# Patient Record
Sex: Female | Born: 1995 | Race: White | Hispanic: No | Marital: Single | Smoking: Never smoker
Health system: Southern US, Community
[De-identification: ages and names within clinical notes are randomized; demographics above are authoritative.]

## PROBLEM LIST (undated history)

## (undated) DIAGNOSIS — F419 Anxiety disorder, unspecified: Secondary | ICD-10-CM

## (undated) DIAGNOSIS — G709 Myoneural disorder, unspecified: Secondary | ICD-10-CM

## (undated) DIAGNOSIS — F329 Major depressive disorder, single episode, unspecified: Secondary | ICD-10-CM

## (undated) DIAGNOSIS — T7840XA Allergy, unspecified, initial encounter: Secondary | ICD-10-CM

## (undated) DIAGNOSIS — F32A Depression, unspecified: Secondary | ICD-10-CM

## (undated) HISTORY — DX: Anxiety disorder, unspecified: F41.9

## (undated) HISTORY — DX: Allergy, unspecified, initial encounter: T78.40XA

## (undated) HISTORY — DX: Myoneural disorder, unspecified: G70.9

## (undated) HISTORY — DX: Depression, unspecified: F32.A

## (undated) HISTORY — DX: Major depressive disorder, single episode, unspecified: F32.9

---

## 2016-02-04 ENCOUNTER — Encounter: Payer: Self-pay | Admitting: Internal Medicine

## 2016-02-04 ENCOUNTER — Ambulatory Visit (INDEPENDENT_AMBULATORY_CARE_PROVIDER_SITE_OTHER): Payer: BLUE CROSS/BLUE SHIELD | Admitting: Internal Medicine

## 2016-02-04 DIAGNOSIS — E669 Obesity, unspecified: Secondary | ICD-10-CM | POA: Insufficient documentation

## 2016-02-04 DIAGNOSIS — F329 Major depressive disorder, single episode, unspecified: Secondary | ICD-10-CM

## 2016-02-04 DIAGNOSIS — K2960 Other gastritis without bleeding: Secondary | ICD-10-CM

## 2016-02-04 DIAGNOSIS — F419 Anxiety disorder, unspecified: Secondary | ICD-10-CM

## 2016-02-04 DIAGNOSIS — G6289 Other specified polyneuropathies: Secondary | ICD-10-CM | POA: Insufficient documentation

## 2016-02-04 DIAGNOSIS — L709 Acne, unspecified: Secondary | ICD-10-CM | POA: Insufficient documentation

## 2016-02-04 DIAGNOSIS — J302 Other seasonal allergic rhinitis: Secondary | ICD-10-CM | POA: Insufficient documentation

## 2016-02-04 DIAGNOSIS — G629 Polyneuropathy, unspecified: Secondary | ICD-10-CM

## 2016-02-04 DIAGNOSIS — F32A Depression, unspecified: Secondary | ICD-10-CM

## 2016-02-04 DIAGNOSIS — T3991XA Poisoning by unspecified nonopioid analgesic, antipyretic and antirheumatic, accidental (unintentional), initial encounter: Secondary | ICD-10-CM

## 2016-02-04 DIAGNOSIS — T39395A Adverse effect of other nonsteroidal anti-inflammatory drugs [NSAID], initial encounter: Secondary | ICD-10-CM

## 2016-02-04 NOTE — Progress Notes (Addendum)
[facility]  Reason for Consult:small fiber polyneuropathy Referring Physician: Dr. Delories Heinz  Patient Active Problem List   Diagnosis Date Noted  . 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 Depression 02/04/2016  . Anxiety 02/04/2016  . Obese 02/04/2016  . Acne 02/04/2016  . Seasonal allergies 02/04/2016  . Gastritis due to nonsteroidal anti-inflammatory drug 02/04/2016    Patient's Medications  New Prescriptions   No medications on file  Previous Medications   DOXYCYCLINE (ORACEA) 40 MG CAPSULE    Take 40 mg by mouth every morning.   ESCITALOPRAM (LEXAPRO) 10 MG TABLET    Take 15 mg by mouth daily.   NORTRIPTYLINE (PAMELOR) 10 MG CAPSULE    Take 10 mg by mouth at bedtime.   OMEPRAZOLE (PRILOSEC) 20 MG CAPSULE    Take 20 mg by mouth daily.  Modified Medications   No medications on file  Discontinued Medications   No medications on file    Recommendations: 1. Repeat Lyme serology   Assessment: I do not believe that her polyneuropathy is likely to be related to infection. She has had extensive testing with no obvious diagnosis. 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 She is still concerned about the possibility of Lyme disease. Although neurologic complications of Lyme are generally considered late manifestations of Lyme, occurring at a time when antibody should be positive have agreed to repeat serologies one more time to make sure that early neurologic complications have not been missed.  HPI: Alexa Montgomery is a 20 y.o. female who was in good health until early July of this year. 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 In early August she had sudden onset of tingling pain in her right foot. She describes it as feeling like her foot was falling asleep, pins and needles and tingling.  About a week later she felt some pain in her left foot and over the next day noted swelling and redness. She is in she had some type of insect bite. She was treated with a brief course of prednisone. Left foot pain and swelling resolved promptly. While she was on prednisone the tingling pain in her right foot went away. It recurred after stopping prednisone and then began to spread up her leg and to her left leg. Over the past several months she's also noted similar sensations in her hands, shoulders and face.over the course of the past several months she has seen 14 different providers. She's had a fairly extensive evaluation with all blood test being normal. She underwent a right leg EMG which was normal. Lyme serology done on 12/15/2015 was negative. She has no known exposure to ticks recently. She was seen by Dr. Gerlene Burdock on 12/31/2015. His note commented on a trigeminal nerve disorder although his documented cranial nerve exam was normal. Brain MRI showed a few, nonspecific white matter lesions in the frontal lobes that he attributed to previous trauma and unrelated to her symptoms. A skin biopsy was performed and read it revealed a small fiber polyneuropathy. Urine heavy metal screen was performed and was negative. A second Lyme antibody was performed on 01/10/2016 and was again negative. She has recently been started on nortriptyline and is feeling better. She is currently enrolled in [employer] and is an education major. After returning to school here she was also  seen by Dr. Amedeo GoryJames Chris[facility]. Blood work done there including serum protein electrophoresis was normal. She tells me that he concurred with the diagnosis of small fiber polyneuropathy. She has routine follow-up with him in 3 months.  She's been on treatment for depression and anxiety for the past 6 years and feels that is under very good control. She has never been sexually active. She does not  use tobacco products, drink alcohol and has never used any illicit drugs.  Review of Systems: Review of Systems  Constitutional: Negative for chills, diaphoresis, fever, malaise/fatigue and weight loss.  HENT: Positive for sore throat.   Eyes: Negative for blurred vision, double vision and photophobia.  Respiratory: Positive for cough. Negative for sputum production, shortness of breath and wheezing.   Cardiovascular: Positive for chest pain.       She had some chest pain last week and was seen in [place]. She was told that she had gastritis related to the end stage was taking or her neuropathic pain. She feels better since changing to acetaminophen.  Gastrointestinal: Positive for nausea. Negative for abdominal pain, diarrhea, heartburn and vomiting.  Genitourinary: Negative for dysuria.  Musculoskeletal: Negative for joint pain and myalgias.  Skin: Negative for rash.  Neurological: Positive for dizziness, tingling, sensory change, weakness and headaches. Negative for focal weakness and loss of consciousness.  Psychiatric/Behavioral: Negative for depression, memory loss and substance abuse. The patient is not nervous/anxious and does not have insomnia.       No past medical history on file.  Social History  Substance Use Topics  . Smoking status: Not on file  . Smokeless tobacco: Not on file  . Alcohol use Not on file    Family History  Problem Relation Age of Onset  . Cancer Mother   . Hypertension Father   . Hyperlipidemia Father    No Known Allergies  OBJECTIVE: Vitals:   02/04/16 1113  Temp: 98.4 F (36.9 C)  TempSrc: Oral  Weight: 194 lb (88 kg)  Height: 5\' 6"  (1.676 m)   Body mass index is 31.31 kg/m.   Physical Exam  Constitutional: She is oriented to person, place, and time.  She is a very pleasant young woman in no distress.  HENT:  Mouth/Throat: No oropharyngeal exudate.  Eyes: Conjunctivae and EOM are normal.  Neck: Neck  supple.  Cardiovascular: Normal rate and regular rhythm.   No murmur heard. Pulmonary/Chest: Effort normal and breath sounds normal. She has no wheezes. She has no rales.  Abdominal: Soft. She exhibits no mass. There is no tenderness.  Musculoskeletal: Normal range of motion. She exhibits no edema or tenderness.  Lymphadenopathy:    She has no cervical adenopathy.    She has no axillary adenopathy.       Right: Inguinal adenopathy present.       Left: No inguinal adenopathy present.  Neurological: She is alert and oriented to person, place, and time. She has normal reflexes. No cranial nerve deficit. Gait normal. Coordination normal.  Skin: No rash noted.  Some facial acne.  Psychiatric: Mood and affect normal.    Microbiology: No results found for this or any previous visit (from the past 240 hour(s)).  Cliffton AstersJohn Andreka Stucki, MD [facility] [phone] pager   [phone] cell 02/04/2016, 12:19 PM

## 2016-02-07 LAB — LYME AB/WESTERN BLOT REFLEX

## 2016-02-25 ENCOUNTER — Encounter (HOSPITAL_COMMUNITY): Payer: Self-pay | Admitting: *Deleted

## 2016-02-25 ENCOUNTER — Emergency Department (HOSPITAL_COMMUNITY): Payer: BLUE CROSS/BLUE SHIELD

## 2016-02-25 ENCOUNTER — Emergency Department (HOSPITAL_COMMUNITY)
Admission: EM | Admit: 2016-02-25 | Discharge: 2016-02-25 | Disposition: A | Discharge status: Home / Self Care | Payer: BLUE CROSS/BLUE SHIELD | Attending: Dermatology | Admitting: Dermatology

## 2016-02-25 DIAGNOSIS — Z5321 Procedure and treatment not carried out due to patient leaving prior to being seen by health care provider: Secondary | ICD-10-CM | POA: Insufficient documentation

## 2016-02-25 DIAGNOSIS — R079 Chest pain, unspecified: Secondary | ICD-10-CM | POA: Insufficient documentation

## 2016-02-25 DIAGNOSIS — R112 Nausea with vomiting, unspecified: Secondary | ICD-10-CM | POA: Insufficient documentation

## 2016-02-25 DIAGNOSIS — R109 Unspecified abdominal pain: Secondary | ICD-10-CM | POA: Diagnosis not present

## 2016-02-25 NOTE — ED Provider Notes (Signed)
ED ECG REPORT   Date: 02/25/2016  Rate: 77  Rhythm: normal sinus rhythm  QRS Axis: normal  Intervals: normal  ST/T Wave abnormalities: normal  Conduction Disutrbances:none  Narrative Interpretation:   Old EKG Reviewed: none available  I have personally reviewed the EKG tracing and agree with the computerized printout as noted.    Vanetta MuldersScott Tammy Ericsson, MD 02/25/16 1017

## 2016-02-25 NOTE — ED Triage Notes (Signed)
Pt states she is feeling better, she feels it was probably the new medication. Pt elects to leave

## 2016-02-25 NOTE — ED Triage Notes (Addendum)
Pt states she is having chest pain, describes it as sharp and aching, chest pain is all over including abd, nausea with vomiting, has been evaluated for this in the past (August 30ish) for same symptoms. Pt vomiting in triage,also started on Voltern last night

## 2017-03-21 IMAGING — CR DG CHEST 2V
2 series · 2 of 2 positions shown · non-contrast
Comparison: None.

CLINICAL DATA: Chest pain for 1 month, worsened today. Initial
encounter.

EXAM:
CHEST  2 VIEW

[w chest pa]
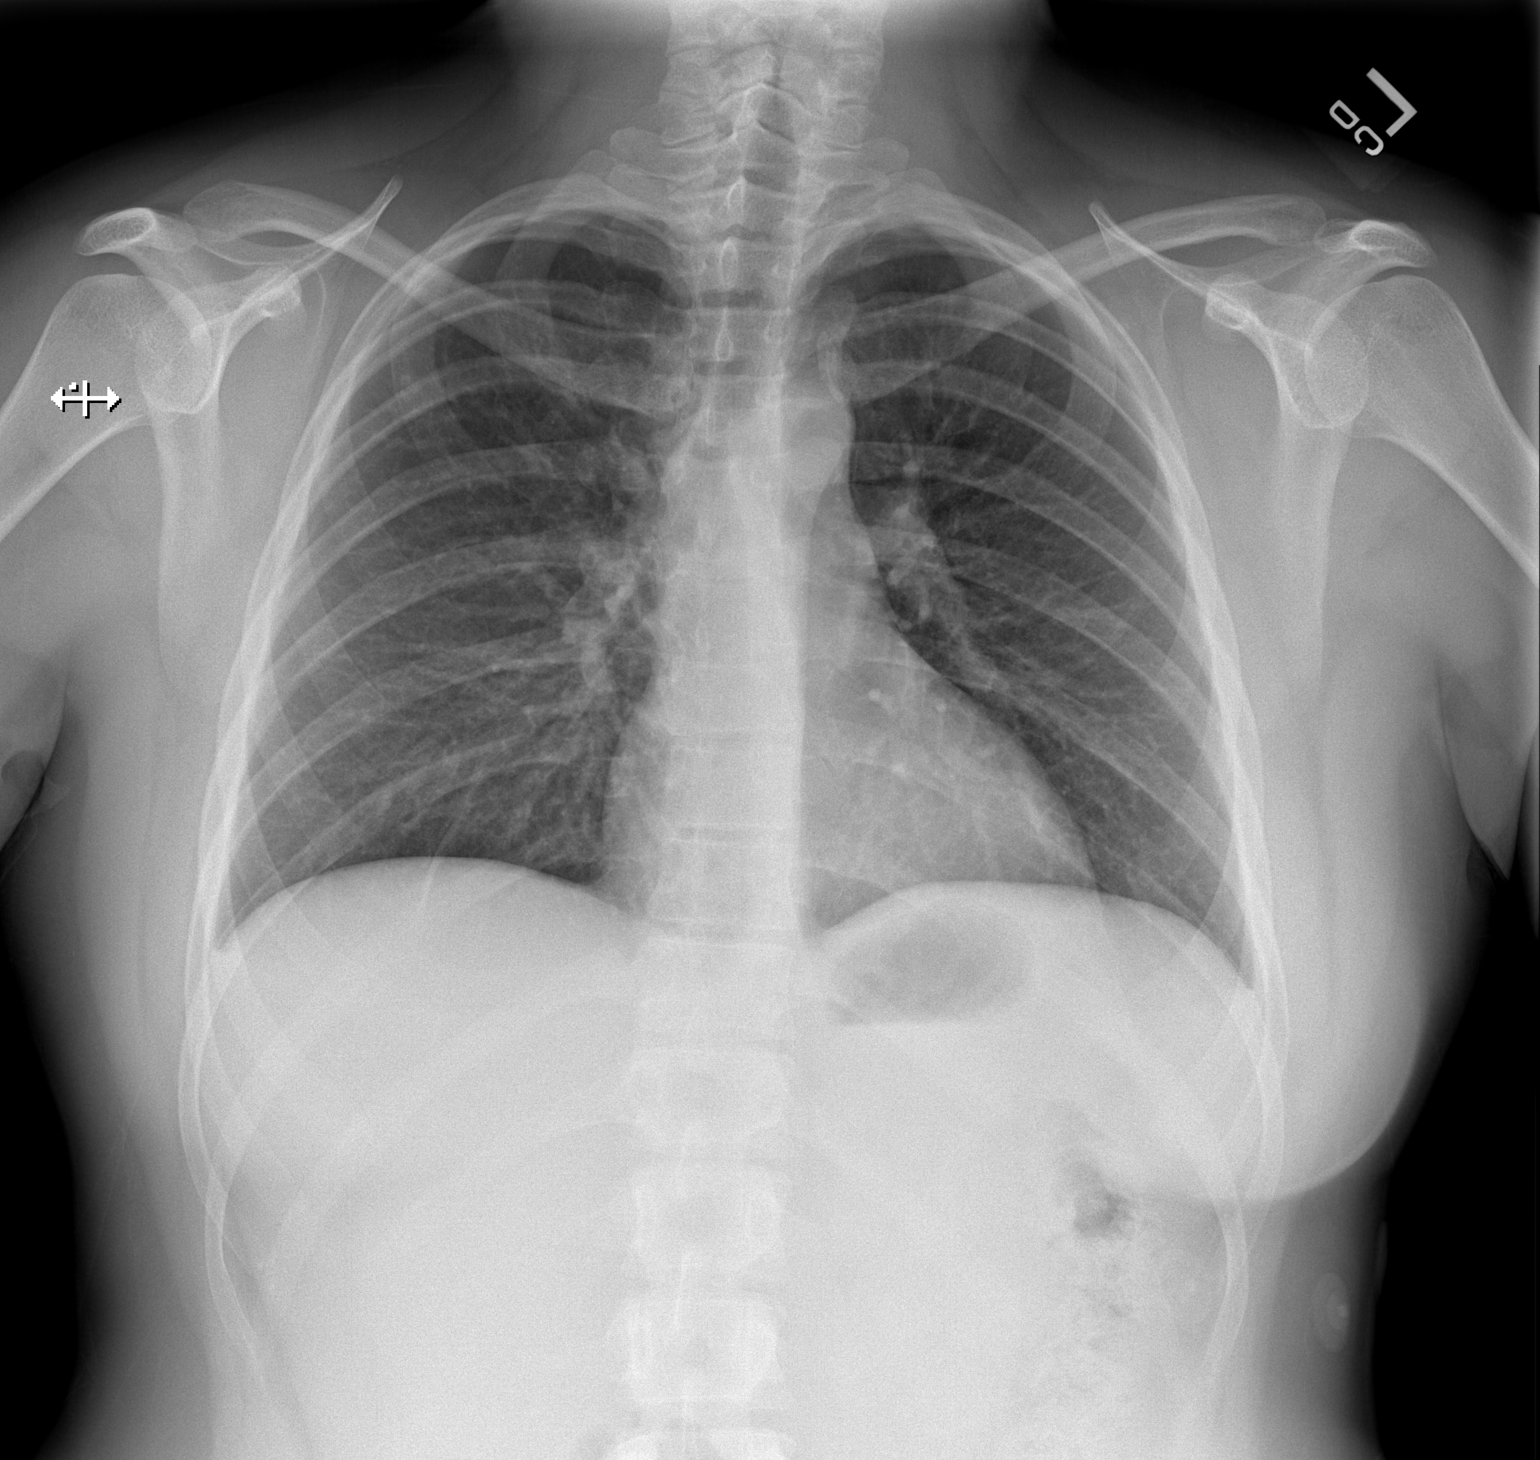

[w chest lat]
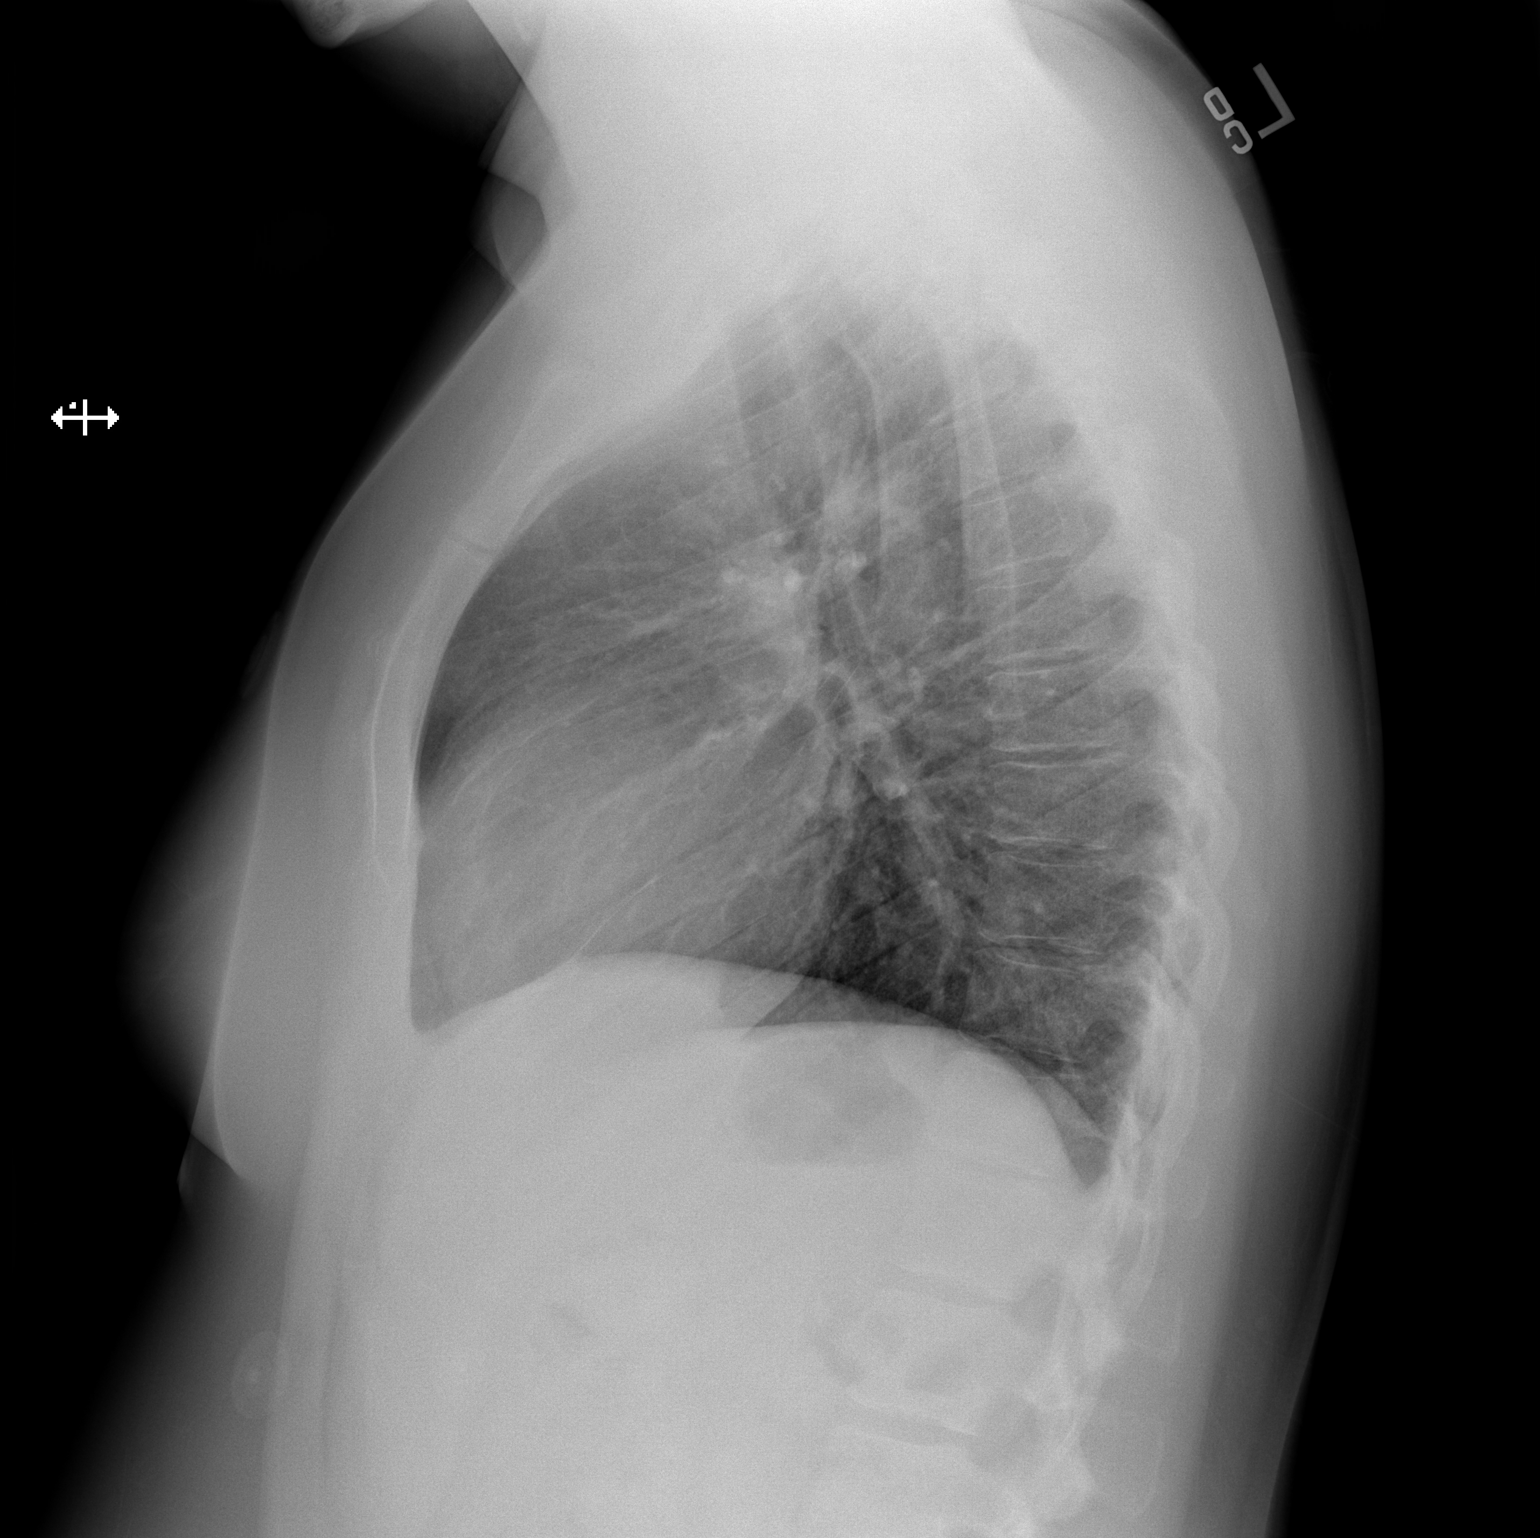

[2 of 2 positions shown; findings below may reference images not displayed]

FINDINGS: The lungs are clear. Heart size is normal. No pneumothorax or
pleural effusion. No focal bony abnormality.
IMPRESSION: Negative chest.
